# Patient Record
Sex: Male | Born: 1955 | Race: White | Hispanic: No | Marital: Married | Smoking: Former smoker
Health system: Southern US, Community
[De-identification: ages and names within clinical notes are randomized; demographics above are authoritative.]

## PROBLEM LIST (undated history)

## (undated) DIAGNOSIS — E119 Type 2 diabetes mellitus without complications: Secondary | ICD-10-CM

## (undated) DIAGNOSIS — E7800 Pure hypercholesterolemia, unspecified: Secondary | ICD-10-CM

## (undated) DIAGNOSIS — I10 Essential (primary) hypertension: Secondary | ICD-10-CM

## (undated) HISTORY — PX: RADIOFREQUENCY ABLATION LIVER TUMOR: SHX2293

---

## 1998-10-31 ENCOUNTER — Ambulatory Visit (HOSPITAL_COMMUNITY): Admission: RE | Admit: 1998-10-31 | Discharge: 1998-10-31 | Payer: Self-pay | Admitting: *Deleted

## 1998-10-31 ENCOUNTER — Encounter: Payer: Self-pay | Admitting: *Deleted

## 2001-07-21 ENCOUNTER — Encounter: Payer: Self-pay | Admitting: *Deleted

## 2001-07-21 ENCOUNTER — Ambulatory Visit (HOSPITAL_COMMUNITY): Admission: RE | Admit: 2001-07-21 | Discharge: 2001-07-21 | Payer: Self-pay | Admitting: *Deleted

## 2002-09-28 ENCOUNTER — Ambulatory Visit (HOSPITAL_COMMUNITY): Admission: RE | Admit: 2002-09-28 | Discharge: 2002-09-28 | Payer: Self-pay | Admitting: *Deleted

## 2002-09-28 ENCOUNTER — Encounter: Payer: Self-pay | Admitting: *Deleted

## 2003-03-15 ENCOUNTER — Ambulatory Visit (HOSPITAL_COMMUNITY): Admission: RE | Admit: 2003-03-15 | Discharge: 2003-03-15 | Payer: Self-pay

## 2004-08-30 IMAGING — US US ABDOMEN COMPLETE
1 series · 14 of 25 positions shown · non-contrast
Comparison: none

CLINICAL DATA: elevated liver function tests
 ABDOMINAL ULTRASOUND 03/15/03 (9899 hours):

[Series 1: unknown · 0.37mm/px · 14 of 83 slices shown]
[im 1/83]
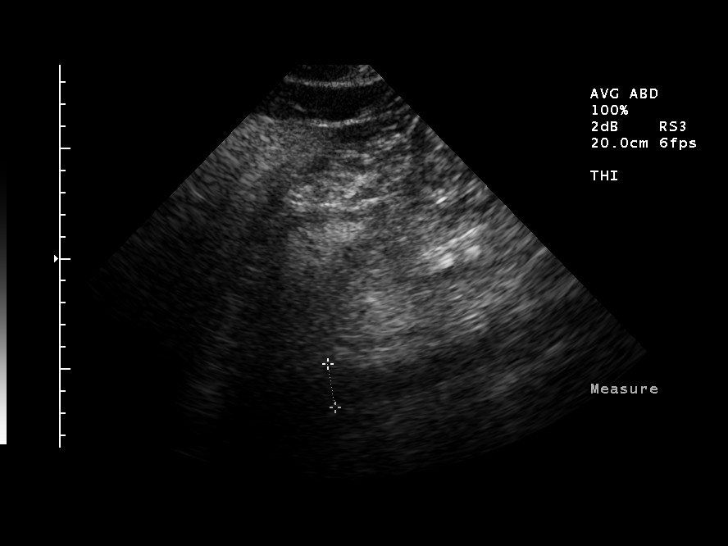
[im 7/83]
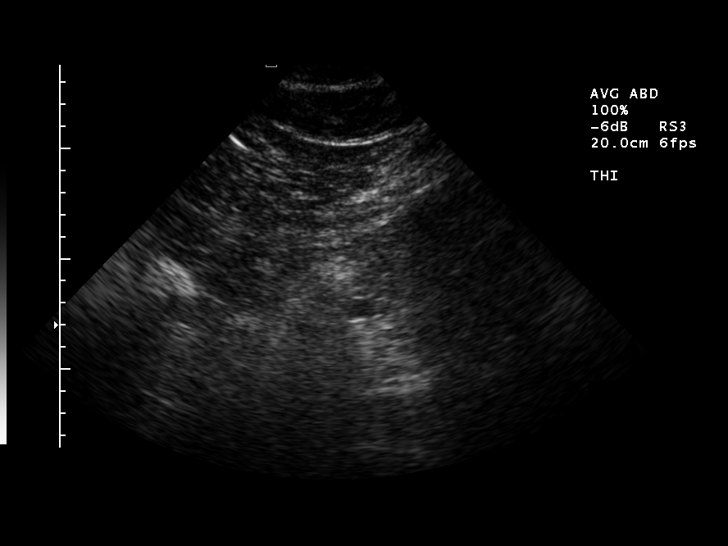
[im 14/83]
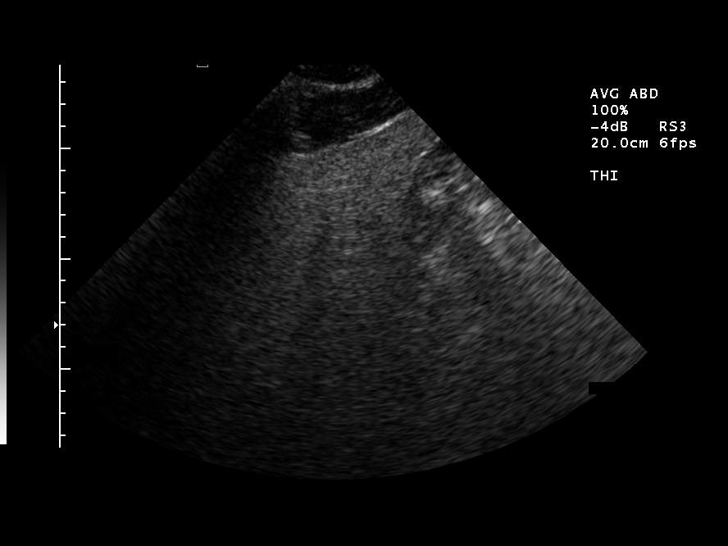
[im 21/83]
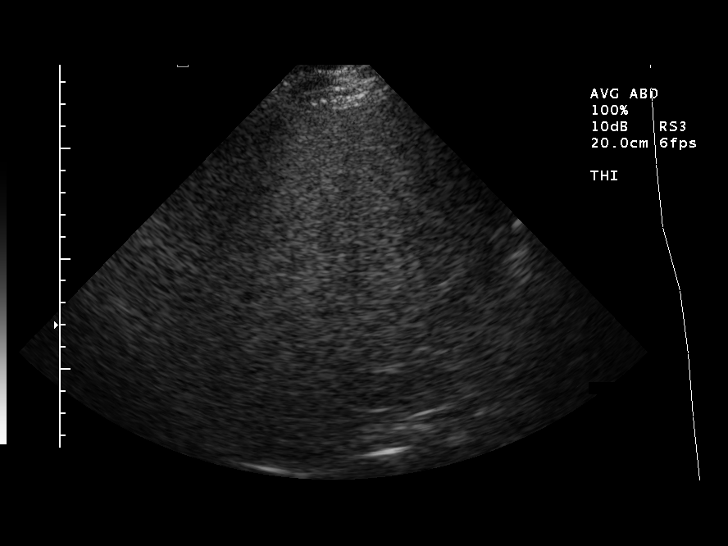
[im 28/83]
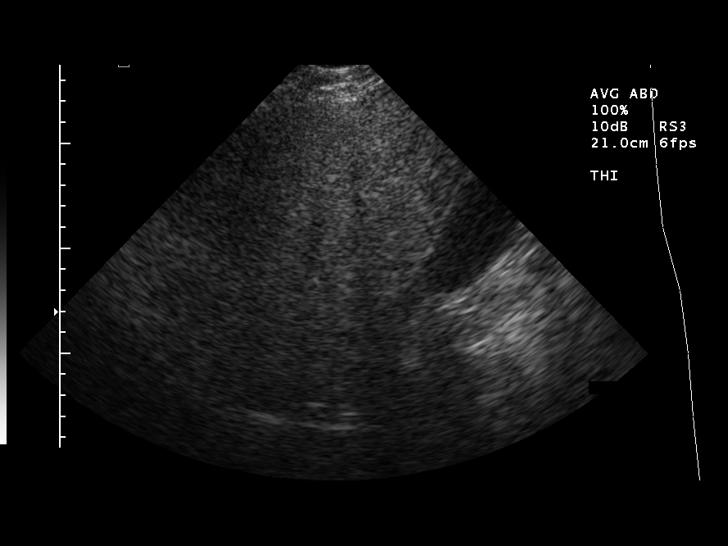
[im 31/83]
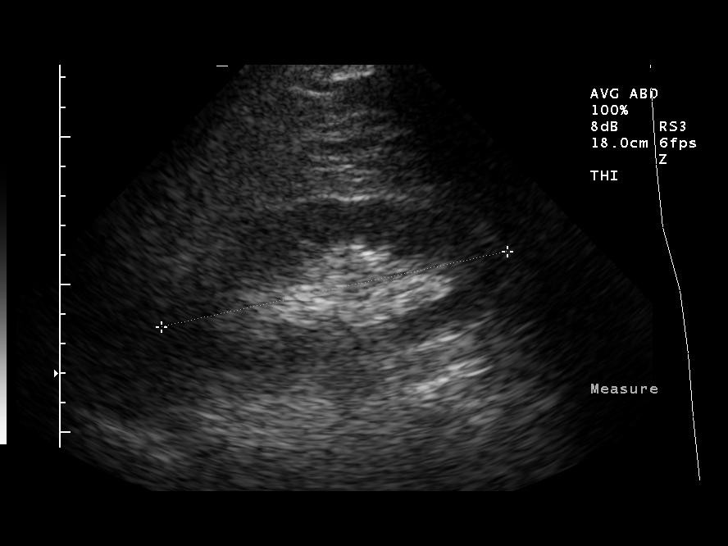
[im 38/83]
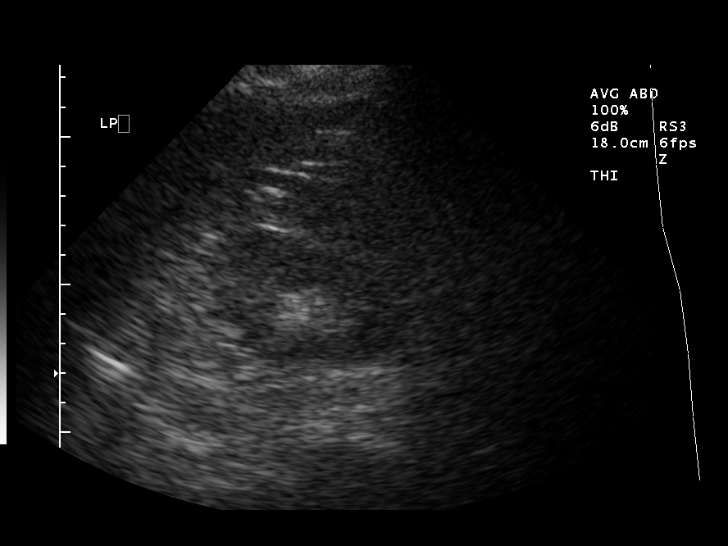
[im 45/83]
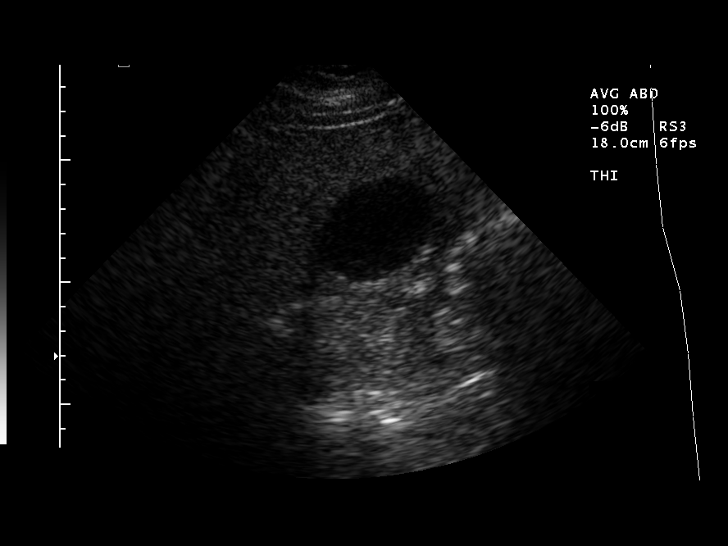
[im 52/83]
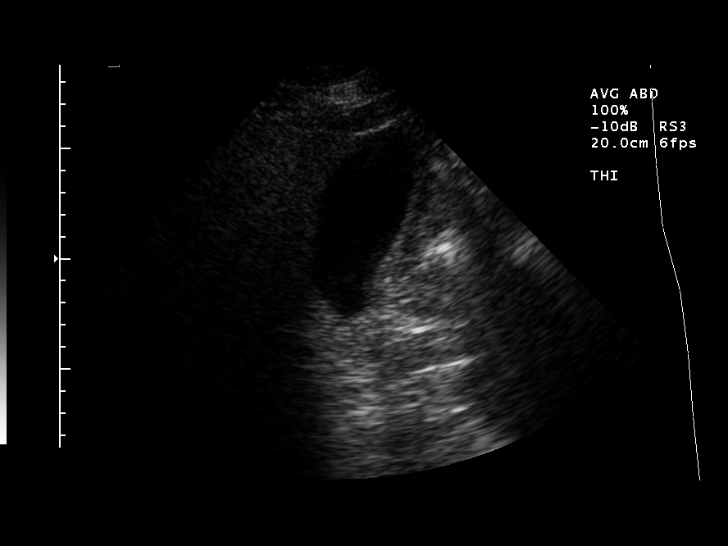
[im 55/83]
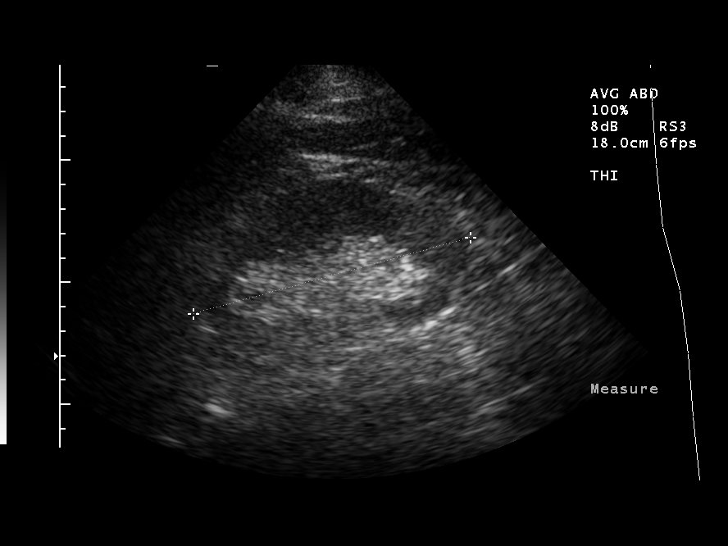
[im 62/83]
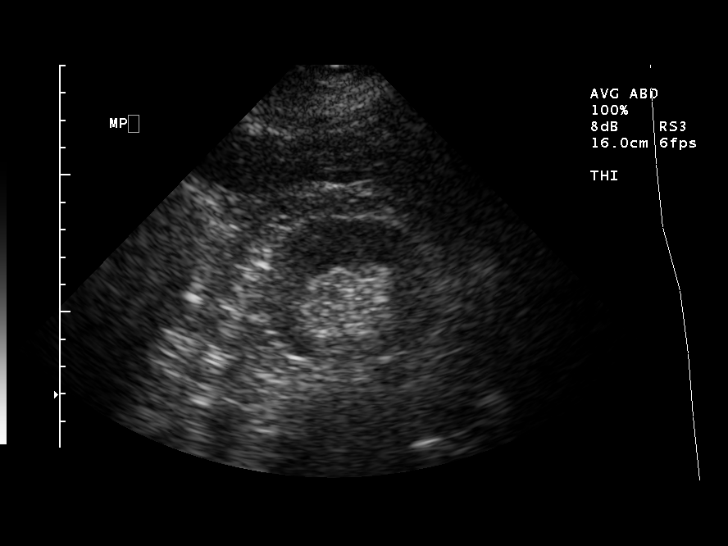
[im 69/83]
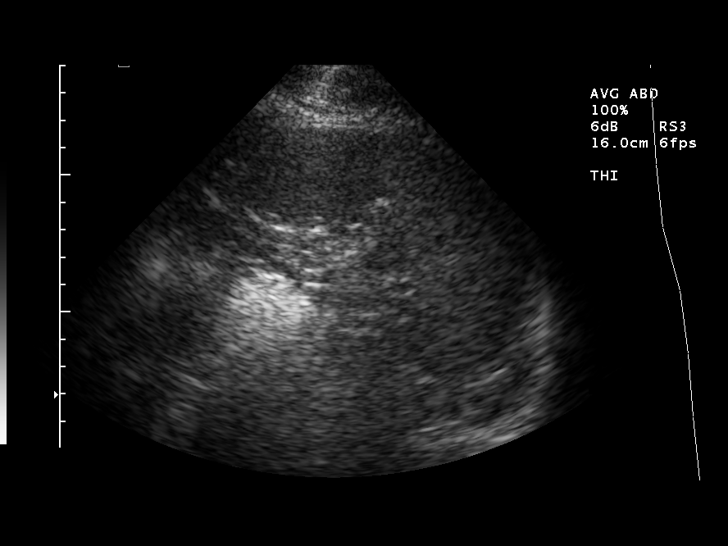
[im 76/83]
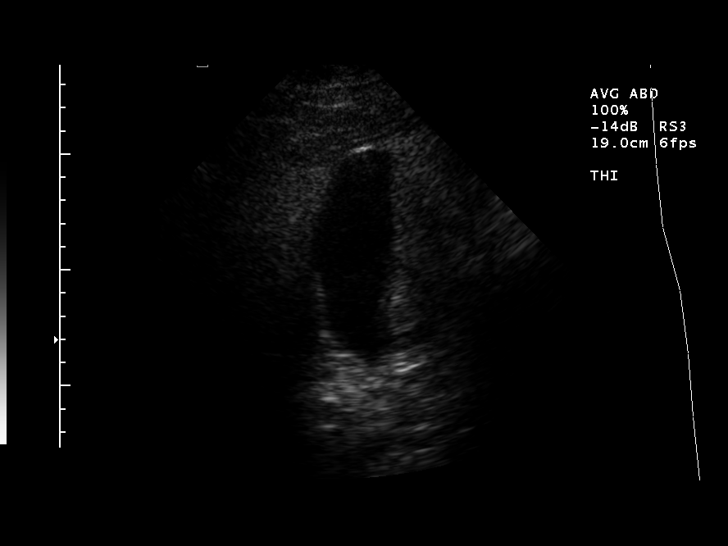
[im 83/83]
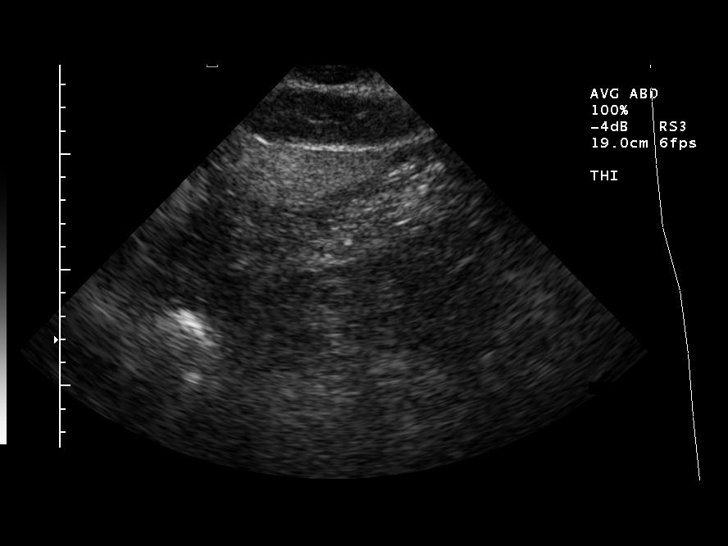

[14 of 25 positions shown; findings below may reference images not displayed]

FINDINGS: Diffuse fatty infiltration of the liver is seen without obvious focal mass effect.  The gallbladder is within normal limits without wall thickening, gallstone or pericholecystic fluid.  The common bile duct is 5mm in caliber.  
 The pancreas was obscured by overlying bowel gas.
 The spleen is within normal limits.  
 The right and left kidneys are 12.0 x 12.2cm in length respectively.  There is no evidence of hydronephrosis or focal mass effect.  Maximal aortic caliber is 2.0cm.  
 The IVC is poorly visualized but grossly patent.
 IMPRESSION
 Fatty infiltration of the liver.  
 Limited visualization of the pancreas.

## 2008-11-05 ENCOUNTER — Ambulatory Visit (HOSPITAL_BASED_OUTPATIENT_CLINIC_OR_DEPARTMENT_OTHER): Admission: RE | Admit: 2008-11-05 | Discharge: 2008-11-05 | Payer: Self-pay | Admitting: Orthopedic Surgery

## 2009-11-18 ENCOUNTER — Inpatient Hospital Stay (HOSPITAL_COMMUNITY)
Admission: RE | Admit: 2009-11-18 | Discharge: 2009-11-21 | Payer: Self-pay | Source: Home / Self Care | Admitting: Orthopedic Surgery

## 2010-04-12 LAB — URINALYSIS, ROUTINE W REFLEX MICROSCOPIC
Ketones, ur: NEGATIVE mg/dL
Nitrite: NEGATIVE
Specific Gravity, Urine: 1.024 (ref 1.005–1.030)
Urobilinogen, UA: 0.2 mg/dL (ref 0.0–1.0)
pH: 6.5 (ref 5.0–8.0)

## 2010-04-12 LAB — COMPREHENSIVE METABOLIC PANEL
ALT: 42 U/L (ref 0–53)
AST: 61 U/L — ABNORMAL HIGH (ref 0–37)
Albumin: 3.8 g/dL (ref 3.5–5.2)
Calcium: 9.8 mg/dL (ref 8.4–10.5)
GFR calc Af Amer: >60 mL/min (ref >60)
Sodium: 138 mEq/L (ref 135–145)
Total Protein: 6.7 g/dL (ref 6.0–8.3)

## 2010-04-12 LAB — DIFFERENTIAL
Eosinophils Absolute: 0.2 10*3/uL (ref 0.0–0.7)
Eosinophils Relative: 2 % (ref 0–5)
Lymphs Abs: 3.3 10*3/uL (ref 0.7–4.0)
Monocytes Relative: 6 % (ref 3–12)

## 2010-04-12 LAB — TYPE AND SCREEN

## 2010-04-12 LAB — CBC
HCT: 38.9 % — ABNORMAL LOW (ref 39.0–52.0)
HCT: 40.2 % (ref 39.0–52.0)
Hemoglobin: 12.3 g/dL — ABNORMAL LOW (ref 13.0–17.0)
Hemoglobin: 13.2 g/dL (ref 13.0–17.0)
Hemoglobin: 14.8 g/dL (ref 13.0–17.0)
MCH: 29.1 pg (ref 26.0–34.0)
MCH: 30 pg (ref 26.0–34.0)
MCHC: 31.6 g/dL (ref 30.0–36.0)
MCHC: 32.8 g/dL (ref 30.0–36.0)
MCHC: 33.6 g/dL (ref 30.0–36.0)
MCV: 91.4 fL (ref 78.0–100.0)
MCV: 92.2 fL (ref 78.0–100.0)
Platelets: 279 10*3/uL (ref 150–400)
RDW: 13.4 % (ref 11.5–15.5)
RDW: 13.6 % (ref 11.5–15.5)
RDW: 13.8 % (ref 11.5–15.5)

## 2010-04-12 LAB — PROTIME-INR
INR: 1.05 (ref 0.00–1.49)
INR: 1.06 (ref 0.00–1.49)

## 2010-04-12 LAB — BASIC METABOLIC PANEL
BUN: 29 mg/dL — ABNORMAL HIGH (ref 6–23)
CO2: 29 mEq/L (ref 19–32)
GFR calc non Af Amer: 46 mL/min — ABNORMAL LOW (ref >60)
Glucose, Bld: 148 mg/dL — ABNORMAL HIGH (ref 70–99)
Potassium: 4 mEq/L (ref 3.5–5.1)

## 2010-04-12 LAB — SURGICAL PCR SCREEN
MRSA, PCR: NEGATIVE
Staphylococcus aureus: NEGATIVE

## 2010-05-04 LAB — BASIC METABOLIC PANEL
CO2: 29 mEq/L (ref 19–32)
Calcium: 9.4 mg/dL (ref 8.4–10.5)
Chloride: 101 mEq/L (ref 96–112)
GFR calc Af Amer: >60 mL/min (ref >60)
Glucose, Bld: 87 mg/dL (ref 70–99)
Potassium: 3.9 mEq/L (ref 3.5–5.1)
Sodium: 139 mEq/L (ref 135–145)

## 2011-05-03 IMAGING — CR DG CHEST 2V
3 series · 3 of 3 positions shown · non-contrast
Comparison: None.

CLINICAL DATA: Preoperative respiratory exam; ankle fusion for
arthritis

CHEST - 2 VIEW

[view not recorded (1 of 3)]
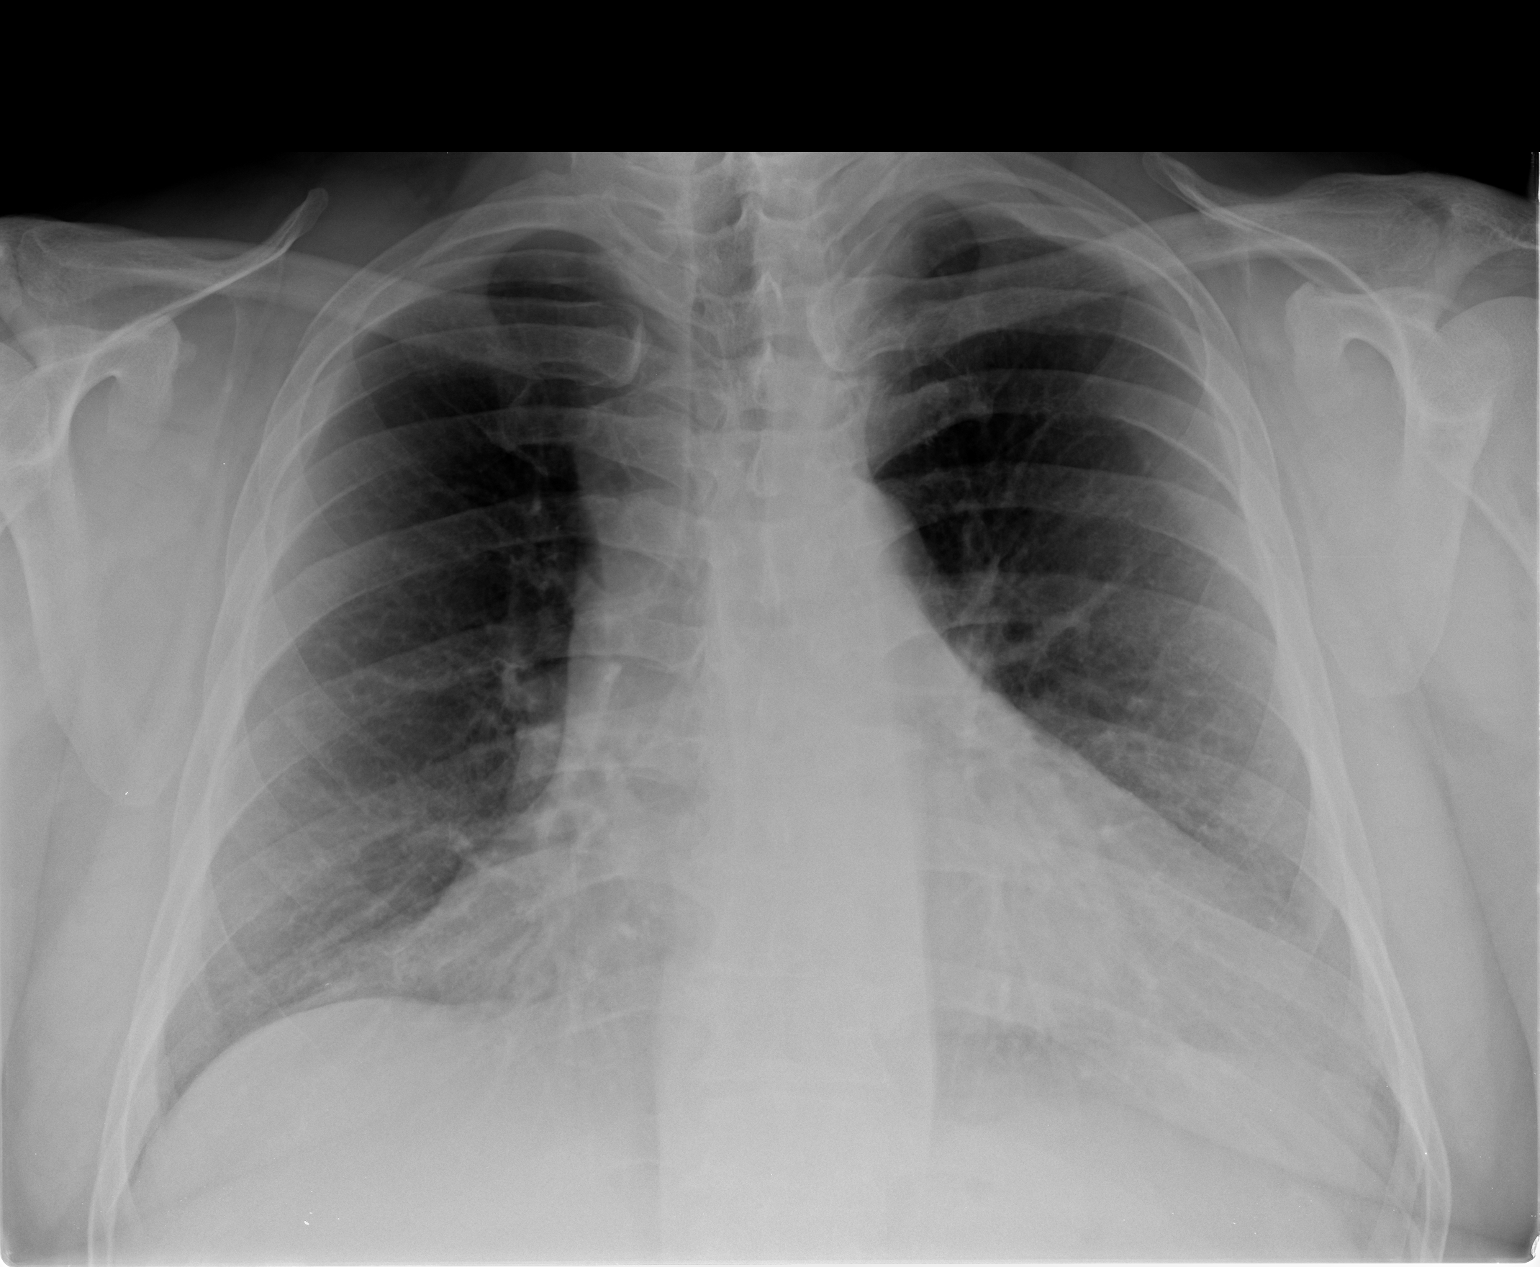

[view not recorded (2 of 3)]
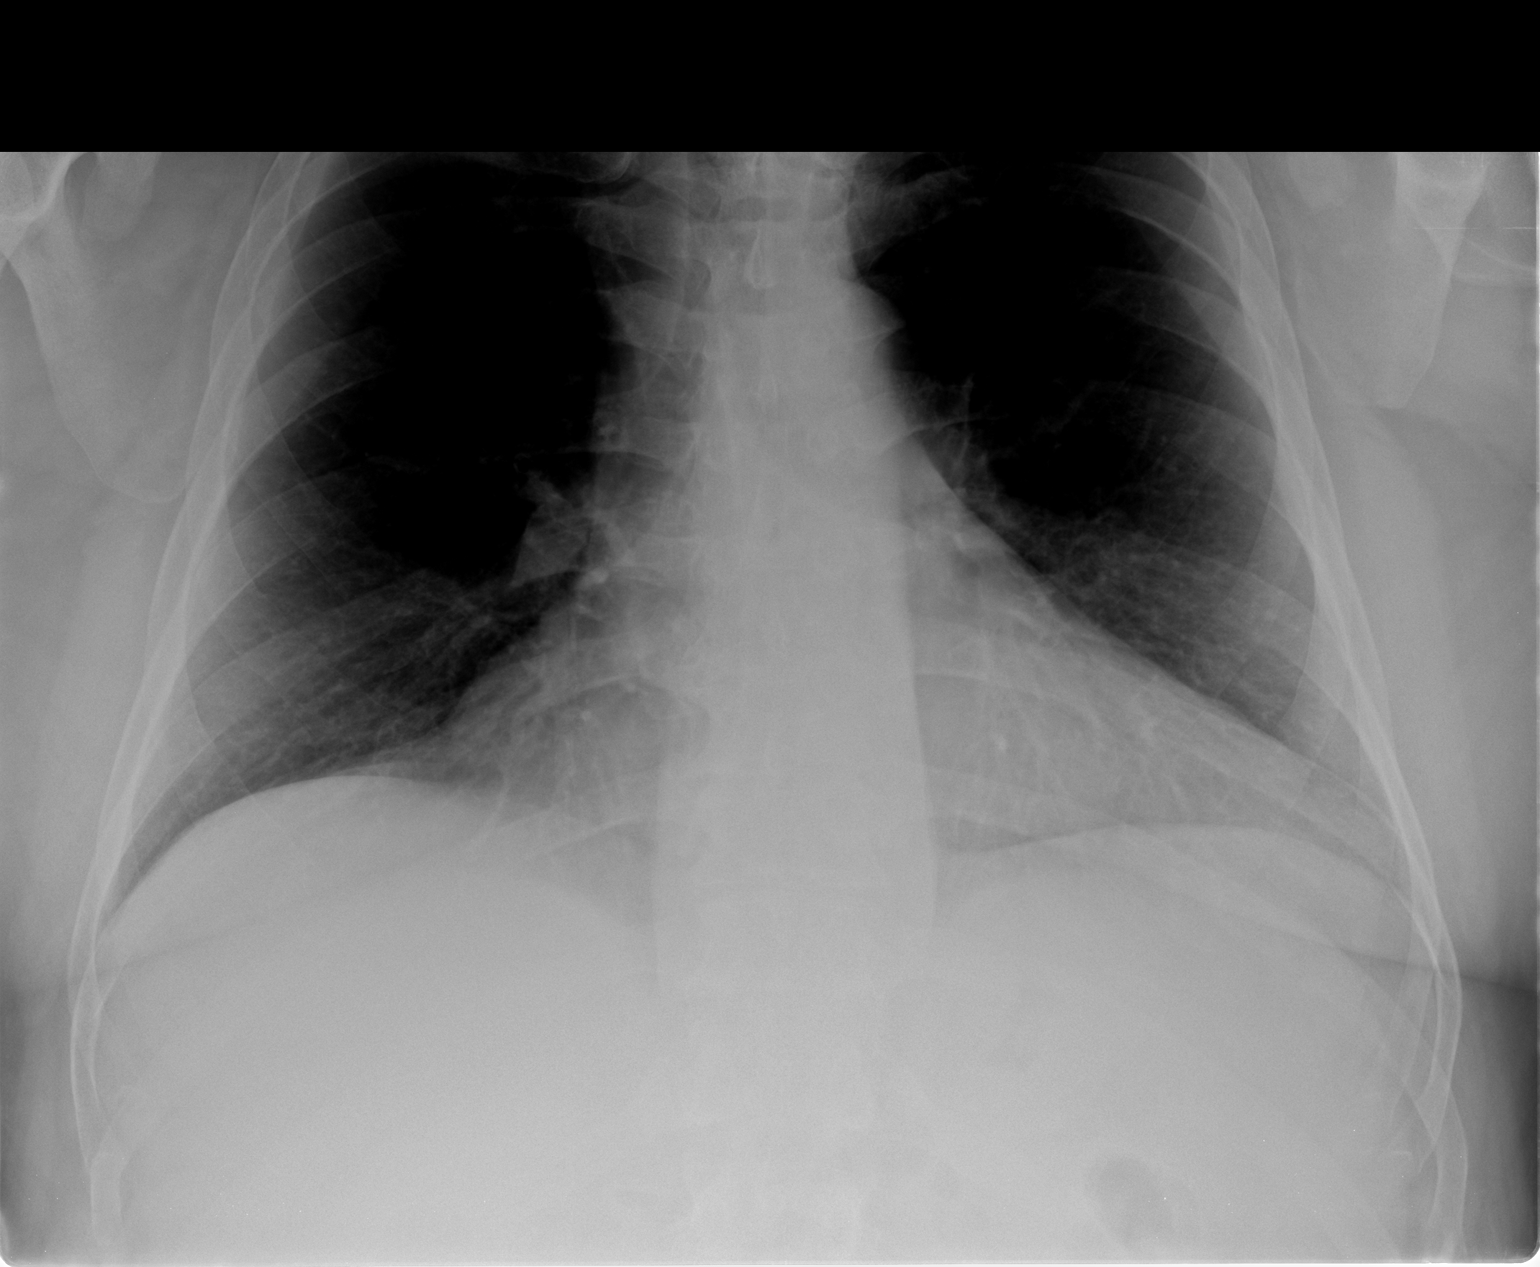

[view not recorded (3 of 3)]
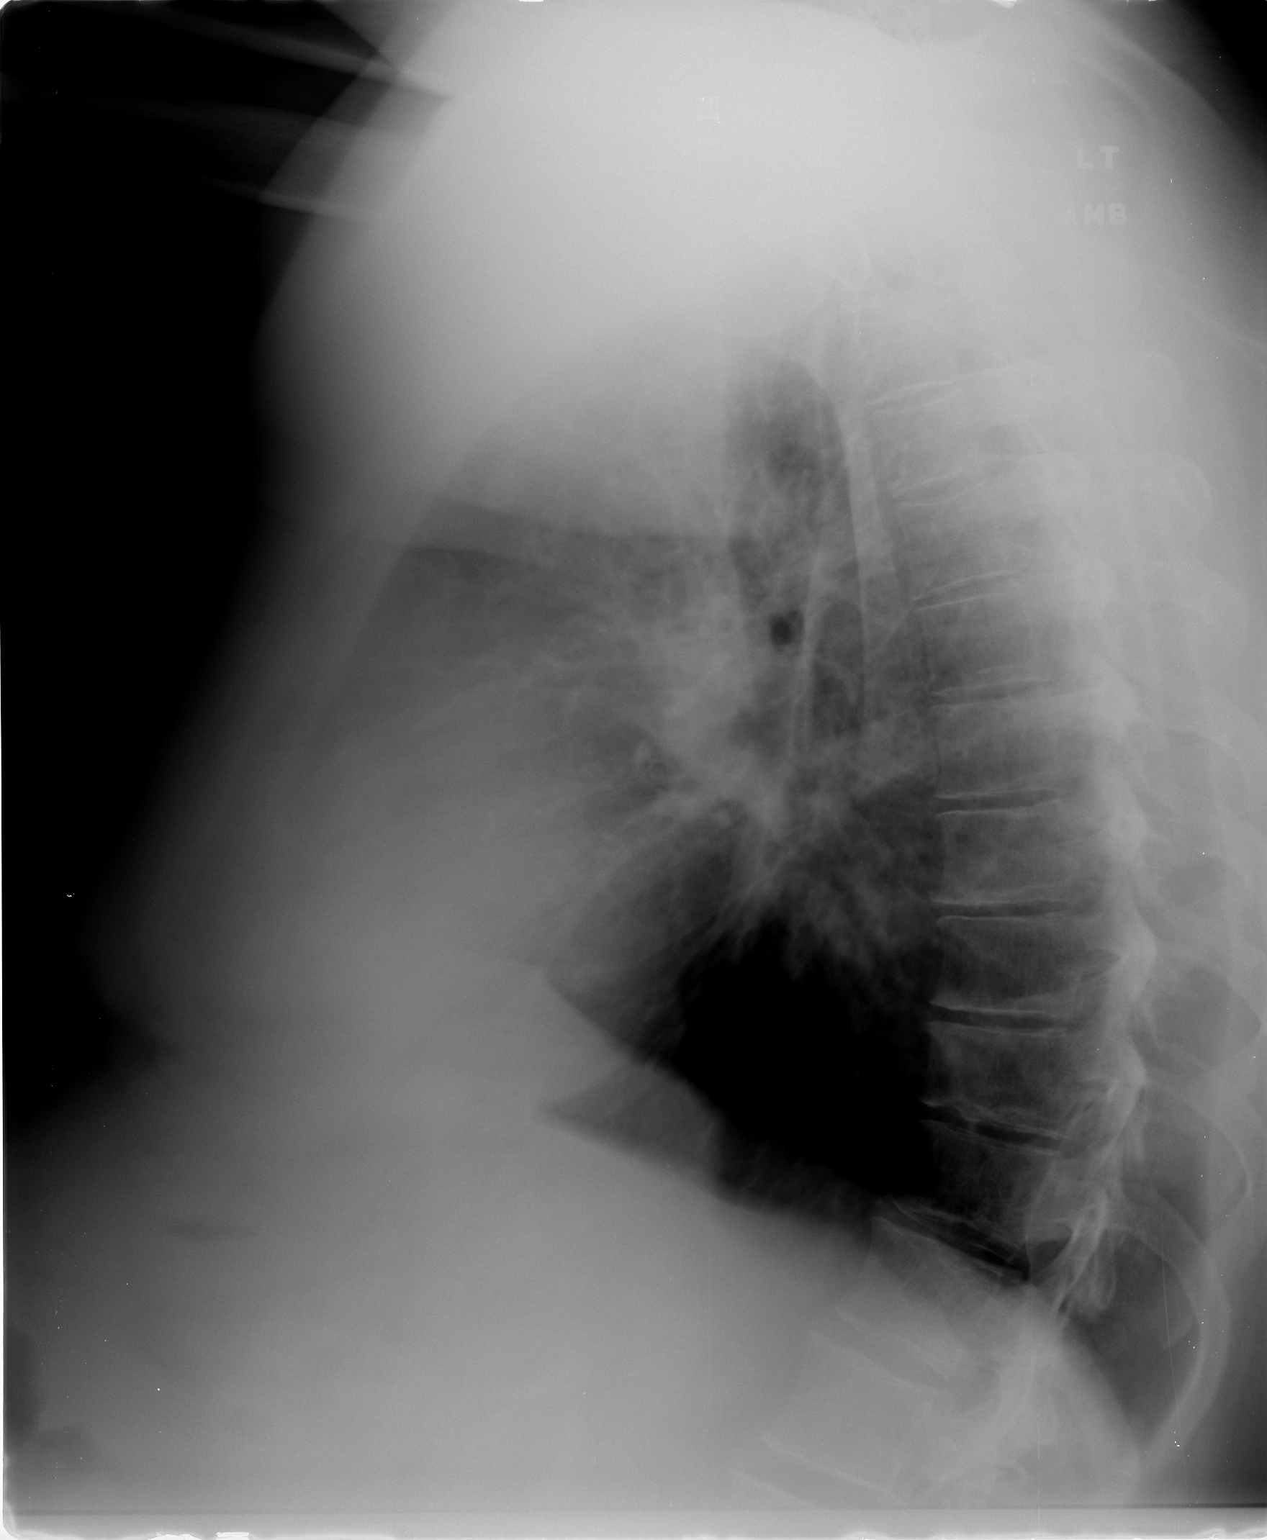

[3 of 3 positions shown; findings below may reference images not displayed]

FINDINGS: The cardiac silhouette, mediastinum, pulmonary
vasculature are within normal limits.  Both lungs are clear.
There is no acute bony abnormality.
IMPRESSION: There is no evidence of acute cardiac or pulmonary process.

## 2020-10-12 ENCOUNTER — Encounter (HOSPITAL_BASED_OUTPATIENT_CLINIC_OR_DEPARTMENT_OTHER): Payer: Self-pay | Admitting: Emergency Medicine

## 2020-10-12 ENCOUNTER — Other Ambulatory Visit: Payer: Self-pay

## 2020-10-12 ENCOUNTER — Emergency Department (HOSPITAL_BASED_OUTPATIENT_CLINIC_OR_DEPARTMENT_OTHER)
Admission: EM | Admit: 2020-10-12 | Discharge: 2020-10-12 | Disposition: A | Discharge status: Home / Self Care | Payer: Medicare Other | Attending: Emergency Medicine | Admitting: Emergency Medicine

## 2020-10-12 DIAGNOSIS — E119 Type 2 diabetes mellitus without complications: Secondary | ICD-10-CM | POA: Diagnosis not present

## 2020-10-12 DIAGNOSIS — Y92410 Unspecified street and highway as the place of occurrence of the external cause: Secondary | ICD-10-CM | POA: Insufficient documentation

## 2020-10-12 DIAGNOSIS — I10 Essential (primary) hypertension: Secondary | ICD-10-CM | POA: Diagnosis not present

## 2020-10-12 DIAGNOSIS — Z87891 Personal history of nicotine dependence: Secondary | ICD-10-CM | POA: Insufficient documentation

## 2020-10-12 DIAGNOSIS — R0789 Other chest pain: Secondary | ICD-10-CM | POA: Diagnosis not present

## 2020-10-12 DIAGNOSIS — M542 Cervicalgia: Secondary | ICD-10-CM | POA: Insufficient documentation

## 2020-10-12 HISTORY — DX: Essential (primary) hypertension: I10

## 2020-10-12 HISTORY — DX: Type 2 diabetes mellitus without complications: E11.9

## 2020-10-12 HISTORY — DX: Pure hypercholesterolemia, unspecified: E78.00

## 2020-10-12 MED ORDER — METHOCARBAMOL 500 MG PO TABS: 500.0000 mg | ORAL_TABLET | Freq: Two times a day (BID) | ORAL | 0 refills | Status: AC

## 2020-10-12 NOTE — Discharge Instructions (Addendum)
Please refer to the attached instructions 
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
